# Patient Record
Sex: Male | Born: 1990
Health system: Southern US, Community
[De-identification: ages and names within clinical notes are randomized; demographics above are authoritative.]

## PROBLEM LIST (undated history)

## (undated) DIAGNOSIS — E669 Obesity, unspecified: Secondary | ICD-10-CM

## (undated) DIAGNOSIS — F909 Attention-deficit hyperactivity disorder, unspecified type: Secondary | ICD-10-CM

## (undated) DIAGNOSIS — L600 Ingrowing nail: Secondary | ICD-10-CM

## (undated) HISTORY — DX: Ingrowing nail: L60.0

## (undated) HISTORY — DX: Obesity, unspecified: E66.9

## (undated) HISTORY — DX: Attention-deficit hyperactivity disorder, unspecified type: F90.9

---
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
## 2012-10-01 ENCOUNTER — Emergency Department (HOSPITAL_COMMUNITY): Payer: Medicaid Other

## 2012-10-01 ENCOUNTER — Emergency Department (INDEPENDENT_AMBULATORY_CARE_PROVIDER_SITE_OTHER)
Admission: EM | Admit: 2012-10-01 | Discharge: 2012-10-01 | Disposition: A | Discharge status: Other Acute Inpatient Hospital | Payer: Medicaid Other | Source: Home / Self Care | Attending: Family Medicine | Admitting: Family Medicine

## 2012-10-01 ENCOUNTER — Emergency Department (HOSPITAL_COMMUNITY)
Admission: EM | Admit: 2012-10-01 | Discharge: 2012-10-01 | Disposition: A | Discharge status: Home / Self Care | Payer: Medicaid Other | Attending: Emergency Medicine | Admitting: Emergency Medicine

## 2012-10-01 ENCOUNTER — Encounter (HOSPITAL_COMMUNITY): Payer: Self-pay | Admitting: *Deleted

## 2012-10-01 ENCOUNTER — Encounter (HOSPITAL_COMMUNITY): Payer: Self-pay | Admitting: Emergency Medicine

## 2012-10-01 DIAGNOSIS — B349 Viral infection, unspecified: Secondary | ICD-10-CM

## 2012-10-01 DIAGNOSIS — R197 Diarrhea, unspecified: Secondary | ICD-10-CM | POA: Insufficient documentation

## 2012-10-01 DIAGNOSIS — R079 Chest pain, unspecified: Secondary | ICD-10-CM | POA: Insufficient documentation

## 2012-10-01 DIAGNOSIS — R1110 Vomiting, unspecified: Secondary | ICD-10-CM | POA: Insufficient documentation

## 2012-10-01 DIAGNOSIS — E860 Dehydration: Secondary | ICD-10-CM

## 2012-10-01 DIAGNOSIS — R05 Cough: Secondary | ICD-10-CM | POA: Insufficient documentation

## 2012-10-01 DIAGNOSIS — F17200 Nicotine dependence, unspecified, uncomplicated: Secondary | ICD-10-CM | POA: Insufficient documentation

## 2012-10-01 DIAGNOSIS — R509 Fever, unspecified: Secondary | ICD-10-CM

## 2012-10-01 DIAGNOSIS — B338 Other specified viral diseases: Secondary | ICD-10-CM | POA: Insufficient documentation

## 2012-10-01 DIAGNOSIS — R059 Cough, unspecified: Secondary | ICD-10-CM | POA: Insufficient documentation

## 2012-10-01 MED ORDER — IBUPROFEN 800 MG PO TABS: 800.0000 mg | ORAL_TABLET | Freq: Three times a day (TID) | ORAL | Status: DC

## 2012-10-01 MED ORDER — PROMETHAZINE HCL 25 MG PO TABS: 25.0000 mg | ORAL_TABLET | Freq: Four times a day (QID) | ORAL | Status: DC | PRN

## 2012-10-01 MED ORDER — ONDANSETRON 4 MG PO TBDP
8.0000 mg | ORAL_TABLET | Freq: Once | ORAL | Status: AC
  Administered 2012-10-01: 8 mg via ORAL

## 2012-10-01 MED ORDER — IBUPROFEN 800 MG PO TABS
ORAL_TABLET | ORAL | Status: AC
  Filled 2012-10-01: qty 1

## 2012-10-01 MED ORDER — ONDANSETRON 4 MG PO TBDP
ORAL_TABLET | ORAL | Status: AC
  Filled 2012-10-01: qty 2

## 2012-10-01 NOTE — ED Provider Notes (Signed)
History     CSN: [id number]  Arrival date & time 10/01/12  1502   First MD Initiated Contact with Patient 10/01/12 1557      Chief Complaint  Patient presents with  . Fever    (Consider location/radiation/quality/duration/timing/severity/associated sxs/prior treatment) Patient is a 21 y.o. male presenting with fever and chest pain. The history is provided by the patient.  Fever Primary symptoms of the febrile illness include fever, cough, vomiting and diarrhea. The current episode started today. This is a new problem. The problem has been gradually improving.  Associated with: cough. Risk factors: none. Chest Pain The chest pain began 6 - 12 hours ago. Chest pain occurs constantly. The severity of the pain is moderate. The quality of the pain is described as aching. The pain does not radiate. Chest pain is worsened by deep breathing. Primary symptoms include a fever, cough and vomiting. There are no known risk factors.  Pertinent negatives for past medical history include no MI.   Pt was seen at urgent care and sent to ED for evaluation.  Pt reports he is feeling better since taking ibuprofen.   He is not vomiting.  Pt reports he has been drinking fluids since leaving urgent care Pt complains of chest soreness. History reviewed. No pertinent past medical history.  History reviewed. No pertinent past surgical history.  History reviewed. No pertinent family history.  History  Substance Use Topics  . Smoking status: Current Every Day Smoker    Types: Cigarettes  . Smokeless tobacco: Not on file  . Alcohol Use: No      Review of Systems  Constitutional: Positive for fever.  Respiratory: Positive for cough.   Cardiovascular: Positive for chest pain.  Gastrointestinal: Positive for vomiting and diarrhea.  All other systems reviewed and are negative.    Allergies  Review of patient's allergies indicates no known allergies.  Home Medications  No current outpatient  prescriptions on file.  BP 114/97  Pulse 103  Temp 98.7 F (37.1 C) (Oral)  Resp 16  SpO2 100%  Physical Exam  Nursing note and vitals reviewed. Constitutional: He is oriented to person, place, and time. He appears well-developed and well-nourished.  HENT:  Head: Normocephalic.  Right Ear: External ear normal.  Left Ear: External ear normal.  Nose: Nose normal.  Mouth/Throat: Oropharynx is clear and moist.  Neck: Normal range of motion. Neck supple.  Cardiovascular: Normal rate, normal heart sounds and intact distal pulses.   Pulmonary/Chest: Effort normal. He exhibits no tenderness.  Abdominal: Soft. Bowel sounds are normal.  Musculoskeletal: Normal range of motion.  Neurological: He is alert and oriented to person, place, and time. He has normal reflexes.  Skin: Skin is warm.  Psychiatric: He has a normal mood and affect.    ED Course  Procedures (including critical care time)  Labs Reviewed - No data to display Dg Chest 2 View  10/01/2012  *RADIOLOGY REPORT*  Clinical Data: 21 year old male with fever, body aches and vomiting.  CHEST - 2 VIEW  Comparison: None  Findings: The cardiomediastinal silhouette is unremarkable. Mild peribronchial thickening is noted. There is no evidence of focal airspace disease, pulmonary edema, suspicious pulmonary nodule/mass, pleural effusion, or pneumothorax. No acute bony abnormalities are identified.  IMPRESSION: Mild peribronchial thickening without focal pneumonia.   Original Report Authenticated By: Harmon Pier, M.D.    EKG from Urgent care reviewed.  No acute.   Chest xray shows no pneumonia.   I suspect viral etiology.  I advised  ibuprofen 800mg .  I will give Rx for phenergan if needed for further vomitting  No diagnosis found.    MDM          Elson Areas, PA 10/01/12 1725  Lonia Skinner [place], [place] 10/01/12 [phone]

## 2012-10-01 NOTE — ED Provider Notes (Signed)
History     CSN: [id number]  Arrival date & time 10/01/12  1326   First MD Initiated Contact with Patient 10/01/12 1344      Chief Complaint  Patient presents with  . Emesis    (Consider location/radiation/quality/duration/timing/severity/associated sxs/prior treatment) HPI Comments: 21 year old smoker male with otherwise no significant past medical history. Here with mother concerned about fever, headache and vomiting x5 since last evening. No seizures. Last emesis was blood tinged. Patient also complaining of left-sided chest pain. Denies cough or congestion. No abdominal pain or diarrhea. Patient reports he was in good health until he started feeling sick about 5 PM yesterday. Patient has been unable to keep fluids down since last evening. No rashes. Denies dysuria. No leg swelling. Not taking any medications   History reviewed. No pertinent past medical history.  No past surgical history on file.  No family history on file.  History  Substance Use Topics  . Smoking status: Not on file  . Smokeless tobacco: Not on file  . Alcohol Use: Not on file      Review of Systems  Constitutional: Positive for fever, chills, diaphoresis and appetite change.  HENT: Negative for congestion, sore throat, rhinorrhea, trouble swallowing and sinus pressure.   Eyes: Negative for visual disturbance.  Respiratory: Negative for cough.   Cardiovascular: Positive for chest pain. Negative for leg swelling.  Gastrointestinal: Positive for nausea and vomiting. Negative for abdominal pain and diarrhea.  Skin: Negative for rash.  Neurological: Positive for headaches. Negative for dizziness.    Allergies  Review of patient's allergies indicates no known allergies.  Home Medications  No current outpatient prescriptions on file.  BP 109/62  Pulse 108  Temp 100.1 F (37.8 C) (Oral)  Resp 22  SpO2 97%  Physical Exam  Nursing note and vitals reviewed. Constitutional: He is oriented to  person, place, and time. He appears well-nourished.       Appears sick, calmed.  HENT:  Head: Normocephalic and atraumatic.  Right Ear: External ear normal.  Left Ear: External ear normal.  Nose: Nose normal.  Mouth/Throat: Oropharynx is clear and moist. No oropharyngeal exudate.  Eyes: Conjunctivae normal and EOM are normal. Pupils are equal, round, and reactive to light. Right eye exhibits no discharge. Left eye exhibits no discharge. No scleral icterus.  Neck: No JVD present.       No neck rigidity but reported discomfort in the back of the neck with nuchal flexion.  Cardiovascular: Normal rate, regular rhythm, normal heart sounds and intact distal pulses.  Exam reveals no gallop and no friction rub.   No murmur heard. Pulmonary/Chest: No respiratory distress. He has no wheezes. He has no rales.       Poor effort, bilateral breath sounds appear clear.  Abdominal: Soft. He exhibits no distension and no mass. There is no tenderness. There is no rebound and no guarding.       No costovertebral tenderness  Lymphadenopathy:    He has no cervical adenopathy.  Neurological: He is oriented to person, place, and time. He has normal reflexes. No cranial nerve deficit.       Cooperative responds appropriately to questions but appears lethargic  Skin: No rash noted. He is diaphoretic.    ED Course  Procedures (including critical care time)  Labs Reviewed - No data to display No results found.   1. Dehydration   2. Fever of unknown origin    EKG: normal sinus rhythm. Ventricular rate 80 beats per minutes.  Peaked T waves in some anterior leads probably related to early repolarization with nonspecific ST changes or other acute ischemic changes.   MDM  21 year old smoker male with no significant past medical history. Here with severe headache, chest pain, nausea and vomiting since yesterday. No cough or congestion. On exam: Temperature 102.1 improved after 800 mg of ibuprofen. Ill-appearing  even after defervescence. Dry lips. Lungs clear, no murmurs. Normal abdominal exam.  Unknown origin of fever. Decided to transfer to the emergency department for further evaluation and management.         Sharin Grave, MD 10/03/12 4098

## 2012-10-01 NOTE — ED Notes (Signed)
To ED for eval of fever and vomiting for past 24hrs. Seen at [facility] and sent to ED for further eval

## 2012-10-01 NOTE — ED Notes (Signed)
Patient has been vomiting, headache, CP, stomach ache. Patient was seen at [facility] and administered tylenol according to the patient. He is afebrile at this time.

## 2012-10-01 NOTE — ED Notes (Addendum)
Reports vomiting which started yesterday.  Patient is trying to keep himself hydrated.  Once patient has vomited all the food patient started vomiting liquid and blood. Denies sore throat or diarrhea.

## 2012-10-01 NOTE — ED Provider Notes (Signed)
Medical screening examination/treatment/procedure(s) were performed by non-physician practitioner and as supervising physician I was immediately available for consultation/collaboration.  Doug Sou, MD 10/01/12 2011

## 2012-11-12 ENCOUNTER — Emergency Department (INDEPENDENT_AMBULATORY_CARE_PROVIDER_SITE_OTHER)
Admission: EM | Admit: 2012-11-12 | Discharge: 2012-11-12 | Disposition: A | Discharge status: Home / Self Care | Payer: Medicaid Other | Source: Home / Self Care | Attending: Emergency Medicine | Admitting: Emergency Medicine

## 2012-11-12 ENCOUNTER — Encounter (HOSPITAL_COMMUNITY): Payer: Self-pay | Admitting: Emergency Medicine

## 2012-11-12 DIAGNOSIS — R1084 Generalized abdominal pain: Secondary | ICD-10-CM

## 2012-11-12 MED ORDER — IBUPROFEN 600 MG PO TABS: 600.0000 mg | ORAL_TABLET | Freq: Four times a day (QID) | ORAL | Status: DC | PRN

## 2012-11-12 NOTE — ED Provider Notes (Signed)
History     CSN: [id number]  Arrival date & time 11/12/12  1124   First MD Initiated Contact with Patient 11/12/12 1247      Chief Complaint  Patient presents with  . Flank Pain    (Consider location/radiation/quality/duration/timing/severity/associated sxs/prior treatment) HPI Comments: Patient presents urgent care describing for the last 2-3 days his left back and sides started hurting again. He works by lifting and pushing things most of the day"... pain gets worse when he moves a certain way or when he touches the affected area. Patient denies any abdominal pain, nausea vomiting or diarrheas or fevers. He basically describes that last summer he sustained a fall from a trampoline, he describes that the pain was solved after 3-4 days.  Patient denies any shortness of breath, nausea or vomiting. Have not taking anything for discomfort pain prior to arrival within the last 2-3 days.  Patient is a 22 y.o. male presenting with flank pain. The history is provided by the patient.  Flank Pain This is a new problem. The current episode started 2 days ago. The problem has been gradually worsening. Pertinent negatives include no abdominal pain. The symptoms are aggravated by bending and twisting. He has tried nothing for the symptoms. The treatment provided no relief.    History reviewed. No pertinent past medical history.  History reviewed. No pertinent past surgical history.  No family history on file.  History  Substance Use Topics  . Smoking status: Current Every Day Smoker    Types: Cigarettes  . Smokeless tobacco: Not on file  . Alcohol Use: No      Review of Systems  Constitutional: Positive for activity change. Negative for chills, diaphoresis and appetite change.  Gastrointestinal: Negative for nausea, vomiting, abdominal pain, constipation, blood in stool, abdominal distention, anal bleeding and rectal pain.  Genitourinary: Positive for flank pain. Negative for frequency,  hematuria, genital sores and testicular pain.  Skin: Negative for pallor, rash and wound.    Allergies  Review of patient's allergies indicates no known allergies.  Home Medications   Current Outpatient Rx  Name  Route  Sig  Dispense  Refill  . IBUPROFEN 600 MG PO TABS   Oral   Take 1 tablet (600 mg total) by mouth every 6 (six) hours as needed for pain.   15 tablet   0   . IBUPROFEN 800 MG PO TABS   Oral   Take 1 tablet (800 mg total) by mouth 3 (three) times daily.   21 tablet   0   . PROMETHAZINE HCL 25 MG PO TABS   Oral   Take 1 tablet (25 mg total) by mouth every 6 (six) hours as needed for nausea.   10 tablet   0     BP 144/81  Pulse 69  Temp 98.2 F (36.8 C) (Oral)  Resp 18  SpO2 100%  Physical Exam  Vitals reviewed. Constitutional: He appears well-developed and well-nourished.  HENT:  Head: Normocephalic.  Eyes: Conjunctivae normal are normal.  Cardiovascular: Normal rate.   No murmur heard. Abdominal: Soft. Normal appearance. He exhibits no distension. There is no hepatosplenomegaly or hepatomegaly. There is no tenderness. There is no rigidity, no rebound, no guarding, no CVA tenderness, no tenderness at McBurney's point and negative Murphy's sign.  Musculoskeletal: He exhibits tenderness.       Back:  Neurological: He is alert.  Skin: No rash noted. No erythema.    ED Course  Procedures (including critical care time)  Labs Reviewed - No data to display No results found.   1. Generalized muscular abdominal pain       MDM  Exam consistent with a Left middle back and lateral abdominal muscular strain/sprain- patient encouraged to take ibuprofen every 6-8 hours, for discomfort.        Jimmie Molly, MD 11/12/12 [phone]

## 2012-11-12 NOTE — ED Notes (Signed)
Patient has left side pain, left torso pain that started 2 days ago.  Patient reports he does a lot of lifting at work.  Patient and mother concerned for injury that occurred during the summer, fell awkwardly on trampoline, no physician evaluation at that time and pain resolved after 4 days per patient.  No difficulty voiding or having bm

## 2012-11-12 NOTE — ED Notes (Signed)
Mother leaving to pick up another child, will return.  Delaine Lame [phone].

## 2015-06-18 ENCOUNTER — Emergency Department (HOSPITAL_COMMUNITY): Payer: Medicare Other

## 2015-06-18 ENCOUNTER — Encounter (HOSPITAL_COMMUNITY): Payer: Self-pay | Admitting: Nurse Practitioner

## 2015-06-18 ENCOUNTER — Emergency Department (HOSPITAL_COMMUNITY)
Admission: EM | Admit: 2015-06-18 | Discharge: 2015-06-18 | Disposition: A | Discharge status: Home / Self Care | Payer: Medicare Other | Attending: Emergency Medicine | Admitting: Emergency Medicine

## 2015-06-18 DIAGNOSIS — R079 Chest pain, unspecified: Secondary | ICD-10-CM | POA: Insufficient documentation

## 2015-06-18 DIAGNOSIS — H11423 Conjunctival edema, bilateral: Secondary | ICD-10-CM | POA: Insufficient documentation

## 2015-06-18 DIAGNOSIS — R51 Headache: Secondary | ICD-10-CM | POA: Diagnosis not present

## 2015-06-18 DIAGNOSIS — E669 Obesity, unspecified: Secondary | ICD-10-CM | POA: Diagnosis not present

## 2015-06-18 DIAGNOSIS — R519 Headache, unspecified: Secondary | ICD-10-CM

## 2015-06-18 DIAGNOSIS — Z720 Tobacco use: Secondary | ICD-10-CM | POA: Insufficient documentation

## 2015-06-18 DIAGNOSIS — R0789 Other chest pain: Secondary | ICD-10-CM

## 2015-06-18 LAB — RAPID URINE DRUG SCREEN, HOSP PERFORMED
AMPHETAMINES: NOT DETECTED
BENZODIAZEPINES: NOT DETECTED
Barbiturates: NOT DETECTED
COCAINE: NOT DETECTED
OPIATES: NOT DETECTED
Tetrahydrocannabinol: NOT DETECTED

## 2015-06-18 LAB — CBC WITH DIFFERENTIAL/PLATELET
Basophils Absolute: 0 10*3/uL (ref 0.0–0.1)
Basophils Relative: 0 % (ref 0–1)
Eosinophils Absolute: 0.1 10*3/uL (ref 0.0–0.7)
Eosinophils Relative: 1 % (ref 0–5)
HEMATOCRIT: 41.5 % (ref 39.0–52.0)
Hemoglobin: 13.4 g/dL (ref 13.0–17.0)
LYMPHS ABS: 1.4 10*3/uL (ref 0.7–4.0)
LYMPHS PCT: 14 % (ref 12–46)
MCH: 27 pg (ref 26.0–34.0)
MCHC: 32.3 g/dL (ref 30.0–36.0)
MCV: 83.5 fL (ref 78.0–100.0)
Monocytes Absolute: 0.5 10*3/uL (ref 0.1–1.0)
Monocytes Relative: 4 % (ref 3–12)
NEUTROS ABS: 8.4 10*3/uL — AB (ref 1.7–7.7)
Neutrophils Relative %: 81 % — ABNORMAL HIGH (ref 43–77)
PLATELETS: 192 10*3/uL (ref 150–400)
RBC: 4.97 MIL/uL (ref 4.22–5.81)
RDW: 13 % (ref 11.5–15.5)
WBC: 10.4 10*3/uL (ref 4.0–10.5)

## 2015-06-18 LAB — URINALYSIS, ROUTINE W REFLEX MICROSCOPIC
BILIRUBIN URINE: NEGATIVE
GLUCOSE, UA: NEGATIVE mg/dL
Hgb urine dipstick: NEGATIVE
KETONES UR: NEGATIVE mg/dL
Leukocytes, UA: NEGATIVE
NITRITE: NEGATIVE
PH: 7.5 (ref 5.0–8.0)
Protein, ur: NEGATIVE mg/dL
Specific Gravity, Urine: 1.019 (ref 1.005–1.030)
UROBILINOGEN UA: 0.2 mg/dL (ref 0.0–1.0)

## 2015-06-18 LAB — BASIC METABOLIC PANEL
ANION GAP: 9 (ref 5–15)
BUN: 10 mg/dL (ref 6–20)
CO2: 26 mmol/L (ref 22–32)
Calcium: 9.2 mg/dL (ref 8.9–10.3)
Chloride: 105 mmol/L (ref 101–111)
Creatinine, Ser: 1.15 mg/dL (ref 0.61–1.24)
GFR calc Af Amer: >60 mL/min (ref >60)
GFR calc non Af Amer: >60 mL/min (ref >60)
Glucose, Bld: 101 mg/dL — ABNORMAL HIGH (ref 65–99)
POTASSIUM: 4.2 mmol/L (ref 3.5–5.1)
Sodium: 140 mmol/L (ref 135–145)

## 2015-06-18 LAB — I-STAT TROPONIN, ED: Troponin i, poc: 0 ng/mL (ref 0.00–0.08)

## 2015-06-18 MED ORDER — SODIUM CHLORIDE 0.9 % IV SOLN
Freq: Once | INTRAVENOUS | Status: AC
  Administered 2015-06-18: 12:00:00 via INTRAVENOUS

## 2015-06-18 MED ORDER — METOCLOPRAMIDE HCL 5 MG/ML IJ SOLN
10.0000 mg | Freq: Once | INTRAMUSCULAR | Status: AC
  Administered 2015-06-18: 10 mg via INTRAVENOUS
  Filled 2015-06-18: qty 2

## 2015-06-18 MED ORDER — DIPHENHYDRAMINE HCL 50 MG/ML IJ SOLN
25.0000 mg | Freq: Once | INTRAMUSCULAR | Status: AC
  Administered 2015-06-18: 25 mg via INTRAVENOUS
  Filled 2015-06-18: qty 1

## 2015-06-18 MED ORDER — BUTALBITAL-APAP-CAFFEINE 50-325-40 MG PO TABS: 1.0000 | ORAL_TABLET | Freq: Four times a day (QID) | ORAL | Status: AC | PRN

## 2015-06-18 NOTE — ED Provider Notes (Signed)
CSN: [id number]     Arrival date & time 06/18/15  1058 History   First MD Initiated Contact with Patient 06/18/15 1109     Chief Complaint  Patient presents with  . Headache     (Consider location/radiation/quality/duration/timing/severity/associated sxs/prior Treatment) HPI   Patient is a 24 year old male, former smoker, otherwise healthy, who presents to the emergency department with a sudden onset right sided frontal and temporal headache that is sharp without radiation, rated 10 out of 10, which is made worse with smells, light and movement, and has associated nausea, sweats, and "weakness all over my whole body," shortness of breath, and left-sided chest pain that is made worse with inspiration, palpation and movement. 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 He states it was preceded by some visual disturbances that he describes as transient vision loss and flashes of light. He currently denies blurry or double vision, but does still have photophobia.  He has had three headaches over the past several years and this one does not feel like his others, because it came on suddenly, was much more sharp and severe.  He denies any fever, chills, neck pain, neck stiffness, numbness, tingling, slurred speech, facial droop, aphasia, dysphagia, LOC, vomiting, facial pain, sinus pressure, congestion, unilateral lacrimation, muscle tension of neck or back, or head trauma.   He denies any cough, wheeze, palpitations, LE edema, recent travel, hx of blood clots or bleeding disorder.  He denies personal or family hx of cancer.  He denies illegal drug use or injected drugs.  Does not take any daily medication, has no known drug allergies, rarely uses ibuprofen, denies current smoking or ETOH use.    History reviewed. No pertinent past medical history. History reviewed. No pertinent past surgical history. History reviewed. No pertinent family  history. Social History  Substance Use Topics  . Smoking status: Current Some Day Smoker    Types: Cigarettes  . Smokeless tobacco: None  . Alcohol Use: No    Review of Systems 10 Systems reviewed and are negative for acute change except as noted in the HPI.      Allergies  Review of patient's allergies indicates no known allergies.  Home Medications   Prior to Admission medications   Medication Sig Start Date End Date Taking? Authorizing Provider  ibuprofen (ADVIL,MOTRIN) 200 MG tablet Take 400 mg by mouth every 6 (six) hours as needed for headache or mild pain.    Yes Historical Provider, MD   BP 105/62 mmHg  Pulse 63  Temp(Src) 98.5 F (36.9 C) (Oral)  Resp 13  Ht  (1.905 m)  Wt 235 lb (106.595 kg)  BMI 29.37 kg/m2  SpO2 100% Physical Exam  Constitutional: He is oriented to person, place, and time. He appears well-developed and well-nourished. No distress.  Obese young man, non-toxic appearing, well developed, well nourished, laying in ER gurney in the dark, with eyes closed, NAD  HENT:  Head: Normocephalic and atraumatic.  Right Ear: External ear normal.  Left Ear: External ear normal.  Nose: Nose normal.  Mouth/Throat: Oropharynx is clear and moist. No oropharyngeal exudate.  Eyes: EOM are normal. Pupils are equal, round, and reactive to light. Right eye exhibits no discharge. Left eye exhibits no discharge. No scleral icterus.  Conjunctiva erythematous bilaterally  Neck: Normal range of motion. Neck supple. No JVD present. No tracheal deviation present. No thyromegaly present.  Normal range of motion, without pain or stiffness  Negative Kernig and negative Brudzinski  Cardiovascular: Normal rate, regular rhythm, normal heart sounds and intact distal pulses.  Exam reveals no gallop and no friction rub.   No murmur heard. Pulmonary/Chest: Effort normal and breath sounds normal. No respiratory distress. He has no wheezes. He has no rales. He exhibits  tenderness.  CTA anteriorly and posteriorly, normal effort, no accessory muscle use, no tachypnea, no cough, left anterior chest wall tender to palpation  Abdominal: Soft. Bowel sounds are normal. He exhibits no distension and no mass. There is no tenderness. There is no rebound and no guarding.  Musculoskeletal: Normal range of motion. He exhibits no edema or tenderness.  Lymphadenopathy:    He has no cervical adenopathy.  Neurological: He is alert and oriented to person, place, and time. He has normal reflexes. No cranial nerve deficit. He exhibits normal muscle tone. Coordination normal.  Speech is clear and goal oriented, follows commands Major Cranial nerves without deficit, no facial droop, normal EOM without nystagmus, normal visual fields Normal strength in upper and lower extremities bilaterally including dorsiflexion and plantar flexion, strong and equal grip strength Sensation normal to light and sharp touch Moves extremities without ataxia, coordination intact, but generally slowed movement Normal finger to nose and rapid alternating movements Neg romberg, no pronator drift Normal gait and balance   Skin: Skin is warm and dry. No rash noted. He is not diaphoretic. No erythema. No pallor.  Psychiatric: He has a normal mood and affect. His behavior is normal. Judgment and thought content normal.  Nursing note and vitals reviewed.   ED Course  Procedures (including critical care time) Labs Review Labs Reviewed  BASIC METABOLIC PANEL - Abnormal; Notable for the following:    Glucose, Bld 101 (*)    All other components within normal limits  CBC WITH DIFFERENTIAL/PLATELET - Abnormal; Notable for the following:    Neutrophils Relative % 81 (*)    Neutro Abs 8.4 (*)    All other components within normal limits  URINALYSIS, ROUTINE W REFLEX MICROSCOPIC (NOT AT [facility])  URINE RAPID DRUG SCREEN, HOSP PERFORMED  I-STAT TROPOININ, ED    Imaging Review Dg Chest 2 View  06/18/2015    CLINICAL DATA:  Sudden onset right frontal and temporal headaches.  EXAM: CHEST  2 VIEW  COMPARISON:  10/01/2012  FINDINGS: The heart size and mediastinal contours are within normal limits. Both lungs are clear. The visualized skeletal structures are unremarkable.  IMPRESSION: No active cardiopulmonary disease.   Electronically Signed   By: Elige Ko   On: 06/18/2015 12:39   Ct Head Wo Contrast  06/18/2015   CLINICAL DATA:  Acute headache.  EXAM: CT HEAD WITHOUT CONTRAST  TECHNIQUE: Contiguous axial images were obtained from the base of the skull through the vertex without intravenous contrast.  COMPARISON:  None.  FINDINGS: Bony calvarium appears intact. No mass effect or midline shift is noted. Ventricular size is within normal limits. There is no evidence of mass lesion, hemorrhage or acute infarction.  IMPRESSION: Normal head CT.   Electronically Signed   By: Lupita Raider, M.D.   On: 06/18/2015 12:49   I, Danelle Berry, personally reviewed and evaluated these images and lab results as part of my medical decision-making.   EKG Interpretation   Date/Time:  Saturday June 18 2015 12:53:16 EDT Ventricular Rate:  66 PR Interval:  156 QRS Duration: 96 QT Interval:  374 QTC Calculation: 392 R Axis:   64 Text Interpretation:  Sinus arrhythmia j  point elevation. no change from  old Confirmed by Donnald Garre, MD, Lebron Conners [phone]) on 06/18/2015 1:17:33 PM      MDM   Final diagnoses:  None   Sudden onset HA - onset approx 3-4 hours ago Ddx - r/o SAH, suspect migraine-type ha, though patient does not carry a formal dx yet  Ct head negative for acute bleed - normal head CT CP reproducible, negative troponin, no EKG changes, no acute pulmonary process  Headache improved with headache cocktail, decreased photosensitivity, pt is asking to eat.  Pt is stable to d/c home with PCP f/u for headaches, possible migraine dx, will give neuro contact info, but with insurance referral will have to come from  PCP.  Dr. Donnald Garre has seen and evaluated the pt and is in agreement with plan and f/up with PCP and neuro for further evaluation of his HA's. Fioricet script given, advised pt to try Excedrin migraine with the onset of HA.  Follow-up encouraged with PCP, neuro referral given.   Danelle Berry, PA-C 06/23/15 [id number]  Arby Barrette, MD 06/23/15 [phone]

## 2015-06-18 NOTE — Discharge Instructions (Signed)
General Headache Without Cause °A headache is pain or discomfort felt around the head or neck area. The specific cause of a headache may not be found. There are many causes and types of headaches. A few common ones are: °· Tension headaches. °· Migraine headaches. °· Cluster headaches. °· Chronic daily headaches. °HOME CARE INSTRUCTIONS  °· Keep all follow-up appointments with your caregiver or any specialist referral. °· Only take over-the-counter or prescription medicines for pain or discomfort as directed by your caregiver. °· Lie down in a dark, quiet room when you have a headache. °· Keep a headache journal to find out what may trigger your migraine headaches. For example, write down: °¨ What you eat and drink. °¨ How much sleep you get. °¨ Any change to your diet or medicines. °· Try massage or other relaxation techniques. °· Put ice packs or heat on the head and neck. Use these 3 to 4 times per day for 15 to 20 minutes each time, or as needed. °· Limit stress. °· Sit up straight, and do not tense your muscles. °· Quit smoking if you smoke. °· Limit alcohol use. °· Decrease the amount of caffeine you drink, or stop drinking caffeine. °· Eat and sleep on a regular schedule. °· Get 7 to 9 hours of sleep, or as recommended by your caregiver. °· Keep lights dim if bright lights bother you and make your headaches worse. °SEEK MEDICAL CARE IF:  °· You have problems with the medicines you were prescribed. °· Your medicines are not working. °· You have a change from the usual headache. °· You have nausea or vomiting. °SEEK IMMEDIATE MEDICAL CARE IF:  °· Your headache becomes severe. °· You have a fever. °· You have a stiff neck. °· You have loss of vision. °· You have muscular weakness or loss of muscle control. °· You start losing your balance or have trouble walking. °· You feel faint or pass out. °· You have severe symptoms that are different from your first symptoms. °MAKE SURE YOU:  °· Understand these  instructions. °· Will watch your condition. °· Will get help right away if you are not doing well or get worse. °Document Released: 10/22/2005 Document Revised: 01/14/2012 Document Reviewed: 11/07/2011 °ExitCare® Patient Information ©2015 [employer]. This information is not intended to replace advice given to you by your health care provider. Make sure you discuss any questions you have with your health care provider. ° ° ° ° °Migraine Headache °A migraine headache is an intense, throbbing pain on one or both sides of your head. A migraine can last for 30 minutes to several hours. °CAUSES  °The exact cause of a migraine headache is not always known. However, a migraine may be caused when nerves in the brain become irritated and release chemicals that cause inflammation. This causes pain. °Certain things may also trigger migraines, such as: °· Alcohol. °· Smoking. °· Stress. °· Menstruation. °· Aged cheeses. °· Foods or drinks that contain nitrates, glutamate, aspartame, or tyramine. °· Lack of sleep. °· Chocolate. °· Caffeine. °· Hunger. °· Physical exertion. °· Fatigue. °· Medicines used to treat chest pain (nitroglycerine), birth control pills, estrogen, and some blood pressure medicines. °SIGNS AND SYMPTOMS °· Pain on one or both sides of your head. °· Pulsating or throbbing pain. °· Severe pain that prevents daily activities. °· Pain that is aggravated by any physical activity. °· Nausea, vomiting, or both. °· Dizziness. °· Pain with exposure to bright lights, loud noises, or activity. °·   General sensitivity to bright lights, loud noises, or smells. °Before you get a migraine, you may get warning signs that a migraine is coming (aura). An aura may include: °· Seeing flashing lights. °· Seeing bright spots, halos, or zigzag lines. °· Having tunnel vision or blurred vision. °· Having feelings of numbness or tingling. °· Having trouble talking. °· Having muscle weakness. °DIAGNOSIS  °A migraine headache is often  diagnosed based on: °· Symptoms. °· Physical exam. °· A CT scan or MRI of your head. These imaging tests cannot diagnose migraines, but they can help rule out other causes of headaches. °TREATMENT °Medicines may be given for pain and nausea. Medicines can also be given to help prevent recurrent migraines.  °HOME CARE INSTRUCTIONS °· Only take over-the-counter or prescription medicines for pain or discomfort as directed by your health care provider. The use of long-term narcotics is not recommended. °· Lie down in a dark, quiet room when you have a migraine. °· Keep a journal to find out what may trigger your migraine headaches. For example, write down: °¨ What you eat and drink. °¨ How much sleep you get. °¨ Any change to your diet or medicines. °· Limit alcohol consumption. °· Quit smoking if you smoke. °· Get 7-9 hours of sleep, or as recommended by your health care provider. °· Limit stress. °· Keep lights dim if bright lights bother you and make your migraines worse. °SEEK IMMEDIATE MEDICAL CARE IF:  °· Your migraine becomes severe. °· You have a fever. °· You have a stiff neck. °· You have vision loss. °· You have muscular weakness or loss of muscle control. °· You start losing your balance or have trouble walking. °· You feel faint or pass out. °· You have severe symptoms that are different from your first symptoms. °MAKE SURE YOU:  °· Understand these instructions. °· Will watch your condition. °· Will get help right away if you are not doing well or get worse. °Document Released: 10/22/2005 Document Revised: 03/08/2014 Document Reviewed: 06/29/2013 °ExitCare® Patient Information ©2015 [employer]. This information is not intended to replace advice given to you by your health care provider. Make sure you discuss any questions you have with your health care provider. ° ° °

## 2015-06-18 NOTE — ED Notes (Signed)
He c/o headaches for past 3 months, worse today. He was at work today and smells made him vomit. He c/o photophobia also. hes been taking OTC meds with minimal relief, advil was the most helpful. A&Ox4, resp e/u

## 2015-06-18 NOTE — ED Notes (Signed)
L. Tapia, PA, in w/pt. 

## 2015-06-18 NOTE — ED Notes (Signed)
Patient transported to X-ray 

## 2016-05-23 ENCOUNTER — Emergency Department (HOSPITAL_COMMUNITY): Payer: Commercial Managed Care - PPO

## 2016-05-23 ENCOUNTER — Encounter (HOSPITAL_COMMUNITY): Payer: Self-pay

## 2016-05-23 ENCOUNTER — Emergency Department (HOSPITAL_COMMUNITY)
Admission: EM | Admit: 2016-05-23 | Discharge: 2016-05-23 | Disposition: A | Discharge status: Home / Self Care | Payer: Commercial Managed Care - PPO | Attending: Emergency Medicine | Admitting: Emergency Medicine

## 2016-05-23 DIAGNOSIS — F17210 Nicotine dependence, cigarettes, uncomplicated: Secondary | ICD-10-CM | POA: Insufficient documentation

## 2016-05-23 DIAGNOSIS — Y999 Unspecified external cause status: Secondary | ICD-10-CM | POA: Diagnosis not present

## 2016-05-23 DIAGNOSIS — M25531 Pain in right wrist: Secondary | ICD-10-CM

## 2016-05-23 DIAGNOSIS — Y92410 Unspecified street and highway as the place of occurrence of the external cause: Secondary | ICD-10-CM | POA: Diagnosis not present

## 2016-05-23 DIAGNOSIS — Y9389 Activity, other specified: Secondary | ICD-10-CM | POA: Insufficient documentation

## 2016-05-23 MED ORDER — NAPROXEN 500 MG PO TABS: 500.0000 mg | ORAL_TABLET | Freq: Two times a day (BID) | ORAL | Status: DC

## 2016-05-23 MED ORDER — CYCLOBENZAPRINE HCL 10 MG PO TABS: 10.0000 mg | ORAL_TABLET | Freq: Two times a day (BID) | ORAL | Status: DC | PRN

## 2016-05-23 NOTE — ED Notes (Signed)
Shawn PA to bedside.
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
## 2016-05-23 NOTE — ED Notes (Signed)
Pt given discharge instructions, verbalized understanding of need to follow up, reasons to return to the ED and medications to take at home. Pt denied further questions or concerns. Pt ambulatory to exit without difficulty.
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
## 2016-05-23 NOTE — ED Notes (Signed)
Pt to XR
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
## 2016-06-12 ENCOUNTER — Encounter: Payer: Self-pay | Admitting: Medical

## 2016-06-12 ENCOUNTER — Ambulatory Visit (INDEPENDENT_AMBULATORY_CARE_PROVIDER_SITE_OTHER): Payer: Commercial Managed Care - PPO | Admitting: Medical

## 2016-06-12 VITALS — BP 120/60 | HR 60 | Resp 16 | Ht 75.0 in | Wt 262.0 lb

## 2016-06-12 DIAGNOSIS — R631 Polydipsia: Secondary | ICD-10-CM | POA: Diagnosis not present

## 2016-06-12 DIAGNOSIS — M25531 Pain in right wrist: Secondary | ICD-10-CM | POA: Diagnosis not present

## 2016-06-12 DIAGNOSIS — M79641 Pain in right hand: Secondary | ICD-10-CM | POA: Diagnosis not present

## 2016-06-12 LAB — POCT URINALYSIS DIPSTICK
BILIRUBIN UA: NEGATIVE
Blood, UA: NEGATIVE
Glucose, UA: NEGATIVE
KETONES UA: NEGATIVE
Leukocytes, UA: NEGATIVE
Nitrite, UA: NEGATIVE
PROTEIN UA: NEGATIVE
Urobilinogen, UA: NEGATIVE
pH, UA: 6
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
## 2016-06-12 NOTE — Addendum Note (Signed)
Addended by: Minette HeadlandBENFIELD, Arash Karstens L on: 06/12/2016 12:50 PM   Modules accepted: Orders

## 2016-07-17 ENCOUNTER — Encounter: Payer: Commercial Managed Care - PPO | Admitting: Medical

## 2016-08-20 ENCOUNTER — Encounter: Payer: Commercial Managed Care - PPO | Admitting: Medical

## 2016-09-05 HISTORY — PX: NO PAST SURGERIES: SHX2092

## 2016-09-11 ENCOUNTER — Encounter: Payer: Self-pay | Admitting: Medical

## 2016-09-11 ENCOUNTER — Ambulatory Visit (INDEPENDENT_AMBULATORY_CARE_PROVIDER_SITE_OTHER): Payer: Commercial Managed Care - PPO | Admitting: Medical

## 2016-09-11 VITALS — BP 112/70 | HR 70 | Ht 75.0 in | Wt 260.0 lb

## 2016-09-11 DIAGNOSIS — Z113 Encounter for screening for infections with a predominantly sexual mode of transmission: Secondary | ICD-10-CM | POA: Diagnosis not present

## 2016-09-11 DIAGNOSIS — Z833 Family history of diabetes mellitus: Secondary | ICD-10-CM | POA: Diagnosis not present

## 2016-09-11 DIAGNOSIS — E669 Obesity, unspecified: Secondary | ICD-10-CM

## 2016-09-11 DIAGNOSIS — Z0000 Encounter for general adult medical examination without abnormal findings: Secondary | ICD-10-CM | POA: Diagnosis not present

## 2016-09-11 DIAGNOSIS — Z23 Encounter for immunization: Secondary | ICD-10-CM | POA: Insufficient documentation

## 2016-09-11 DIAGNOSIS — Z7185 Encounter for immunization safety counseling: Secondary | ICD-10-CM

## 2016-09-11 DIAGNOSIS — Z7189 Other specified counseling: Secondary | ICD-10-CM

## 2016-09-11 DIAGNOSIS — Z1271 Encounter for screening for malignant neoplasm of testis: Secondary | ICD-10-CM

## 2016-09-11 LAB — POCT URINALYSIS DIPSTICK
Bilirubin, UA: NEGATIVE
GLUCOSE UA: NEGATIVE
Ketones, UA: NEGATIVE
Leukocytes, UA: NEGATIVE
NITRITE UA: NEGATIVE
PH UA: 6
PROTEIN UA: NEGATIVE
RBC UA: NEGATIVE
SPEC GRAV UA: 1.02
UROBILINOGEN UA: NEGATIVE

## 2016-09-11 LAB — COMPREHENSIVE METABOLIC PANEL
ALBUMIN: 4.7 g/dL (ref 3.6–5.1)
ALT: 13 U/L (ref 9–46)
AST: 18 U/L (ref 10–40)
Alkaline Phosphatase: 45 U/L (ref 40–115)
BILIRUBIN TOTAL: 0.4 mg/dL (ref 0.2–1.2)
BUN: 9 mg/dL (ref 7–25)
CHLORIDE: 106 mmol/L (ref 98–110)
CO2: 25 mmol/L (ref 20–31)
CREATININE: 1.33 mg/dL (ref 0.60–1.35)
Calcium: 9.9 mg/dL (ref 8.6–10.3)
GLUCOSE: 96 mg/dL (ref 65–99)
Potassium: 4.3 mmol/L (ref 3.5–5.3)
SODIUM: 139 mmol/L (ref 135–146)
Total Protein: 7.3 g/dL (ref 6.1–8.1)

## 2016-09-11 LAB — LIPID PANEL
CHOL/HDL RATIO: 5.2 ratio — AB (ref <5.0)
Cholesterol: 194 mg/dL (ref <200)
HDL: 37 mg/dL — AB (ref >40)
LDL CALC: 140 mg/dL — AB
Triglycerides: 87 mg/dL (ref <150)
VLDL: 17 mg/dL (ref <30)

## 2016-09-11 LAB — CBC
HEMATOCRIT: 41.5 % (ref 38.5–50.0)
Hemoglobin: 13.6 g/dL (ref 13.2–17.1)
MCH: 27 pg (ref 27.0–33.0)
MCHC: 32.8 g/dL (ref 32.0–36.0)
MCV: 82.3 fL (ref 80.0–100.0)
MPV: 11.3 fL (ref 7.5–12.5)
Platelets: 221 10*3/uL (ref 140–400)
RBC: 5.04 MIL/uL (ref 4.20–5.80)
RDW: 13.9 % (ref 11.0–15.0)
WBC: 4.3 10*3/uL (ref 4.0–10.5)

## 2016-09-11 LAB — TSH: TSH: 1.17 m[IU]/L (ref 0.40–4.50)
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
## 2016-09-12 LAB — GC/CHLAMYDIA PROBE AMP
CT Probe RNA: NOT DETECTED
GC PROBE AMP APTIMA: NOT DETECTED

## 2016-09-12 LAB — RPR

## 2016-09-12 LAB — HIV ANTIBODY (ROUTINE TESTING W REFLEX): HIV 1&2 Ab, 4th Generation: NONREACTIVE

## 2016-09-12 LAB — HEMOGLOBIN A1C
HEMOGLOBIN A1C: 5.4 % (ref <5.7)
Mean Plasma Glucose: 108 mg/dL
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
## 2019-11-06 HISTORY — PX: NO PAST SURGERIES: SHX2092

## 2019-11-12 ENCOUNTER — Ambulatory Visit (INDEPENDENT_AMBULATORY_CARE_PROVIDER_SITE_OTHER): Payer: Medicare Other | Admitting: Medical

## 2019-11-12 ENCOUNTER — Telehealth: Payer: Self-pay | Admitting: Medical

## 2019-11-12 ENCOUNTER — Other Ambulatory Visit: Payer: Self-pay

## 2019-11-12 ENCOUNTER — Encounter: Payer: Self-pay | Admitting: Medical

## 2019-11-12 VITALS — BP 100/76 | HR 78 | Temp 97.6°F | Ht 75.0 in | Wt 261.0 lb

## 2019-11-12 DIAGNOSIS — Z6832 Body mass index (BMI) 32.0-32.9, adult: Secondary | ICD-10-CM

## 2019-11-12 DIAGNOSIS — Z131 Encounter for screening for diabetes mellitus: Secondary | ICD-10-CM | POA: Diagnosis not present

## 2019-11-12 DIAGNOSIS — L738 Other specified follicular disorders: Secondary | ICD-10-CM

## 2019-11-12 DIAGNOSIS — Z720 Tobacco use: Secondary | ICD-10-CM

## 2019-11-12 DIAGNOSIS — M2140 Flat foot [pes planus] (acquired), unspecified foot: Secondary | ICD-10-CM | POA: Insufficient documentation

## 2019-11-12 DIAGNOSIS — M2142 Flat foot [pes planus] (acquired), left foot: Secondary | ICD-10-CM

## 2019-11-12 DIAGNOSIS — M2141 Flat foot [pes planus] (acquired), right foot: Secondary | ICD-10-CM

## 2019-11-12 DIAGNOSIS — Z0000 Encounter for general adult medical examination without abnormal findings: Secondary | ICD-10-CM

## 2019-11-12 NOTE — Patient Instructions (Signed)
Preventative Care for Adults - Male    Thank you for coming in for your well visit today, and thank you for trusting [place] with your care!   Maintain regular health and wellness exams:  A routine yearly physical is a good way to check in with your primary care provider about your health and preventive screening. It is also an opportunity to share updates about your health and any concerns you have, and receive a thorough all-over exam.   Most health insurance companies pay for at least some preventative services.  Check with your health plan for specific coverages.  What preventative services do men need?  Adult men should have their weight and blood pressure checked regularly.   Men age 87 and older should have their cholesterol levels checked regularly.  Beginning at age 26 and continuing to age 44, men should be screened for colorectal cancer.  Certain people may need continued testing until age 11.  Updating vaccinations is part of preventative care.  Vaccinations help protect against diseases such as the flu.  Osteoporosis is a disease in which the bones lose minerals and strength as we age. Men ages 67 and over should discuss this with their caregivers  Lab tests are generally done as part of preventative care to screen for anemia and blood disorders, to screen for problems with the kidneys and liver, to screen for bladder problems, to check blood sugar, and to check your cholesterol level.  Preventative services generally include counseling about diet, exercise, avoiding tobacco, drugs, excessive alcohol consumption, and sexually transmitted infections.   Xrays and CT scans are not normally done as a preventative test, and most insurances do not pay for imaging for screening other than as discussed under cancer screens below.   On the other hand, if you have certain medical concerns, imaging may be necessary as a diagnostic test.    Your Medical Team Your medical team starts with  [place], your PCP or primary care provider.  Please use our services for your routine care such as physicals, screenings, immunizations, sick visits, and your first stop for general medical concerns.  You can call our number for after hours information for urgent questions that may need attention but cannot wait til the next business day.    Urgent care-urgent cares exist to provide care when your primary care office would typically be closed such as evenings or weekends.   Urgent care is for evaluation of urgent medical problems that do not necessarily require emergency department care, but cannot wait til the next business day when we are open.  Emergency department care-please reserve emergency department care for serious, urgent, possibly life-threatening medical problems.  This includes issues like possible stroke, heart attack, significant injury, mental health crisis, or other urgent need that requires immediate medical attention.     See your dentist office twice yearly for hygiene and cleaning visits.   Brush your teeth and floss your teeth daily.  See your eye doctor yearly for routine eye exam and screenings for glaucoma and retinal disease.   Specific Concerns: Flat feet - check with [employer] to find some good supportive shoes for flat feet  STOP TOBACCO   Vaccines:  Stay up to date with your tetanus shots and other required immunizations. You should have a booster for tetanus every 10 years. Be sure to get your flu shot every year, since 5%-20% of the [place] population comes down with the flu. The flu vaccine changes each year, so  being vaccinated once is not enough. Get your shot in the fall, before the flu season peaks.   Other vaccines to consider:  Pneumococcal vaccine to protect against certain types of pneumonia.  This is normally recommended for adults age 61 or older.  However, adults younger than 27 years old with certain underlying conditions such as diabetes, heart or lung  disease should also receive the vaccine.  Shingles vaccine to protect against Varicella Zoster if you are older than age 70, or younger than 27 years old with certain underlying illness.  If you have not had the Shingrix vaccine, please call your insurer to inquire about coverage for the Shingrix vaccine given in 2 doses.   Some insurers cover this vaccine after age 6, some cover this after age 24.  If your insurer covers this, then call to schedule appointment to have this vaccine here  Hepatitis A vaccine to protect against a form of infection of the liver by a virus acquired from food.  Hepatitis B vaccine to protect against a form of infection of the liver by a virus acquired from blood or body fluids, particularly if you work in health care.  If you plan to travel internationally, check with your local health department for specific vaccination recommendations.  Human Papilloma Virus or HPV causes cancer of the cervix, and other infections that can be transmitted from person to person. There is a vaccine for HPV, and males should get immunized between the ages of 8 and 59. It requires a series of 3 shots.   Covid/Coronavirus - as the vaccines are becoming available, please consider vaccination if you are a health care worker, first responder, or have significant health problems such as asthma, COPD, heart disease, hypertension, diabetes, obesity, multiple medical problems, over age 19yo, or immunocompromised.       What should I know about Cancer screening? Many types of cancers can be detected early and may often be prevented. Lung Cancer  You should be screened every year for lung cancer if: ? You are a current smoker who has smoked for at least 30 years. ? You are a former smoker who has quit within the past 15 years.  Talk to your health care provider about your screening options, when you should start screening, and how often you should be screened.  Colorectal  Cancer  Routine colorectal cancer screening usually begins at 27 years of age and should be repeated every 5-10 years until you are 27 years old. You may need to be screened more often if early forms of precancerous polyps or small growths are found. Your health care provider may recommend screening at an earlier age if you have risk factors for colon cancer.  Your health care provider may recommend using home test kits to check for hidden blood in the stool.  A small camera at the end of a tube can be used to examine your colon (sigmoidoscopy or colonoscopy). This checks for the earliest forms of colorectal cancer.  Prostate and Testicular Cancer  Depending on your age and overall health, your health care provider may do certain tests to screen for prostate and testicular cancer.  Talk to your health care provider about any symptoms or concerns you have about testicular or prostate cancer.  Skin Cancer  Check your skin from head to toe regularly.  Tell your health care provider about any new moles or changes in moles, especially if: ? There is a change in a mole's size, shape, or  color. ? You have a mole that is larger than a pencil eraser.  Always use sunscreen. Apply sunscreen liberally and repeat throughout the day.  Protect yourself by wearing long sleeves, pants, a wide-brimmed hat, and sunglasses when outside.   Are you up to date on cancer screenings:  COLON CANCER SCREENING:  Age 24  PROSTATE CANCER SCREENING:  Age 48  TESTICULAR CANCER SCREENING:  check your self monthly at home     GENERAL RECOMMENDATIONS FOR GOOD HEALTH:  Healthy diet:  Eat a variety of foods, including fruit, vegetables, animal or vegetable protein, such as meat, fish, chicken, and eggs, or beans, lentils, tofu, and grains, such as rice.  Drink plenty of water daily.  Decrease saturated fat in the diet, avoid lots of red meat, processed foods, sweets, fast foods, and fried  foods.  Exercise:  Aerobic exercise helps maintain good heart health. At least 30-40 minutes of moderate-intensity exercise is recommended. For example, a brisk walk that increases your heart rate and breathing. This should be done on most days of the week.   Find a type of exercise or a variety of exercises that you enjoy so that it becomes a part of your daily life.  Examples are running, walking, swimming, water aerobics, and biking.  For motivation and support, explore group exercise such as aerobic class, spin class, Zumba, Yoga,or  martial arts, etc.    Set exercise goals for yourself, such as a certain weight goal, walk or run in a race such as a 5k walk/run.  Speak to your primary care provider about exercise goals.  Your weight readings per our records: Wt Readings from Last 3 Encounters:  11/12/19 261 lb (118.4 kg)  09/11/16 260 lb (117.9 kg)  06/12/16 262 lb (118.8 kg)    Body mass index is 32.62 kg/m.    Disease prevention:  If you smoke or chew tobacco, find out from your caregiver how to quit. It can literally save your life, no matter how long you have been a tobacco user. If you do not use tobacco, never begin.   Maintain a healthy diet and normal weight. Increased weight leads to problems with blood pressure and diabetes.   The Body Mass Index or BMI is a way of measuring how much of your body is fat. Having a BMI above 27 increases the risk of heart disease, diabetes, hypertension, stroke and other problems related to obesity. Your caregiver can help determine your BMI and based on it develop an exercise and dietary program to help you achieve or maintain this important measurement at a healthful level.  High blood pressure causes heart and blood vessel problems.  Persistent high blood pressure should be treated with medicine if weight loss and exercise do not work.  Your blood pressure readings per our records:     BP Readings from Last 3 Encounters:  11/12/19  100/76  09/11/16 112/70  06/12/16 120/60     Fat and cholesterol leaves deposits in your arteries that can block them. This causes heart disease and vessel disease elsewhere in your body.  If your cholesterol is found to be high, or if you have heart disease or certain other medical conditions, then you may need to have your cholesterol monitored frequently and be treated with medication.   Ask if you should have a cardiac stress test if your history suggests this. A stress test is a test done on a treadmill that looks for heart disease. This test can find  disease prior to there being a problem.    Heart disease screening:   n/a   Osteoporosis is a disease in which the bones lose minerals and strength as we age. This can result in serious bone fractures. Risk of osteoporosis can be identified using a bone density scan. Men ages 73 and over should discuss this with their caregivers. Ask your caregiver whether you should be taking a calcium supplement and Vitamin D, to reduce the rate of osteoporosis.   Avoid drinking alcohol in excess (more than two drinks per day).  Avoid use of street drugs. Do not share needles with anyone. Ask for professional help if you need assistance or instructions on stopping the use of alcohol, cigarettes, and/or drugs.  Brush your teeth twice a day with fluoride toothpaste, and floss once a day. Good oral hygiene prevents tooth decay and gum disease. The problems can be painful, unattractive, and can cause other health problems. Visit your dentist for a routine oral and dental check up and preventive care every 6-12 months.   Safety:  Use seatbelts 100% of the time, whether driving or as a passenger.  Use safety devices such as hearing protection if you work in environments with loud noise or significant background noise.  Use safety glasses when doing any work that could send debris in to the eyes.  Use a helmet if you ride a bike or motorcycle.  Use appropriate  safety gear for contact sports.  Talk to your caregiver about gun safety.  Use sunscreen with a SPF (or skin protection factor) of 15 or greater.  Lighter skinned people are at a greater risk of skin cancer. Don't forget to also wear sunglasses in order to protect your eyes from too much damaging sunlight. Damaging sunlight can accelerate cataract formation.   Keep carbon monoxide and smoke detectors in your home functioning at all times. Change the batteries every 6 months or use a model that plugs into the wall.    Sexual activity: . Sex is a normal part of life and sexual activity can continue into older adulthood for many healthy people.   . If you are having erectile dysfunction issues, please follow up to discuss this further.   . If you are not in a monogamous relationship or have more than one partner, please practice safe sex.  Use condoms. Condoms are used for birth control and to help reduce the spread of sexually transmitted infections (or STIs).  Some of the STIs are gonorrhea (the clap), chlamydia, syphilis, trichomonas, herpes, HPV (human papilloma virus) and HIV (human immunodeficiency virus) which causes AIDS. The herpes, HIV and HPV are viral illnesses that have no cure. These can result in disability, cancer and death.   We are able to test for STIs here at our office.

## 2019-11-12 NOTE — Telephone Encounter (Signed)
Please abstract or update [facility] vaccines to Epic chart record

## 2019-11-12 NOTE — Progress Notes (Signed)
Subjective:   HPI  Duane Beck is a 29 y.o. male who presents for Chief Complaint  Patient presents with  . Annual Exam    with fastins labs    Patient Care Team: Danaja Lasota, Camelia Eng, PA-C as PCP - General (Family Medicine) Sees dentist Sees eye doctor  Concerns: Here for physical.  No specific findings.  He does want his foot looked at.  He has flat feet.  Not painful and no specific issues.  He is exercising some with walking and calisthenics and push-ups.  Not currently employed.  Has history of ADHD.  Has not been on medication for years.   Reviewed their medical, surgical, family, social, medication, and allergy history and updated chart as appropriate.  Past Medical History:  Diagnosis Date  . ADHD    no medication in years  . Ingrown toenail   . Obesity     Past Surgical History:  Procedure Laterality Date  . NO PAST SURGERIES  11/2019    Social History   Socioeconomic History  . Marital status: Single    Spouse name: Not on file  . Number of children: Not on file  . Years of education: Not on file  . Highest education level: Not on file  Occupational History  . Not on file  Tobacco Use  . Smoking status: Current Some Day Smoker    Years: 5.00    Types: Cigarettes, E-cigarettes  . Smokeless tobacco: Current User  . Tobacco comment: vaping  Substance and Sexual Activity  . Alcohol use: No  . Drug use: No  . Sexual activity: Yes    Birth control/protection: Condom  Other Topics Concern  . Not on file  Social History Narrative   Lives with parents.   Currently unemployed.   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  Had job with [employer] and prior with [employer] as well.  Exercise - walking and calisthenics.   As of 11/2019.   Social Determinants of Health   Financial Resource Strain:   . Difficulty of Paying Living Expenses: Not on file  Food Insecurity:   . Worried About Charity fundraiser in the Last Year: Not on file  . Ran Out of Food in the Last Year:  Not on file  Transportation Needs:   . Lack of Transportation (Medical): Not on file  . Lack of Transportation (Non-Medical): Not on file  Physical Activity:   . Days of Exercise per Week: Not on file  . Minutes of Exercise per Session: Not on file  Stress:   . Feeling of Stress : Not on file  Social Connections:   . Frequency of Communication with Friends and Family: Not on file  . Frequency of Social Gatherings with Friends and Family: Not on file  . Attends Religious Services: Not on file  . Active Member of Clubs or Organizations: Not on file  . Attends Archivist Meetings: Not on file  . Marital Status: Not on file  Intimate Partner Violence:   . Fear of Current or Ex-Partner: Not on file  . Emotionally Abused: Not on file  . Physically Abused: Not on file  . Sexually Abused: Not on file    Family History  Problem Relation Age of Onset  . Hypertension Mother   . Hyperlipidemia Mother   . Diabetes Mother   . Sleep apnea Mother   . Diabetes Father   . Cancer Neg Hx   . Heart disease Neg Hx   .  Stroke Neg Hx     No current outpatient medications on file.  Allergies  Allergen Reactions  . Other     Cat dander, pollen/environmental     Review of Systems Constitutional: -fever, -chills, -sweats, -unexpected weight change, -decreased appetite, -fatigue Allergy: -sneezing, -itching, -congestion Dermatology: -changing moles, --rash, -lumps ENT: -runny nose, -ear pain, -sore throat, -hoarseness, -sinus pain, -teeth pain, - ringing in ears, -hearing loss, -nosebleeds Cardiology: -chest pain, -palpitations, -swelling, -difficulty breathing when lying flat, -waking up short of breath Respiratory: -cough, -shortness of breath, -difficulty breathing with exercise or exertion, -wheezing, -coughing up blood Gastroenterology: -abdominal pain, -nausea, -vomiting, -diarrhea, -constipation, -blood in stool, -changes in bowel movement, -difficulty swallowing or  eating Hematology: -bleeding, -bruising  Musculoskeletal: -joint aches, -muscle aches, -joint swelling, -back pain, -neck pain, -cramping, -changes in gait Ophthalmology: denies vision changes, eye redness, itching, discharge Urology: -burning with urination, -difficulty urinating, -blood in urine, -urinary frequency, -urgency, -incontinence Neurology: -headache, -weakness, -tingling, -numbness, -memory loss, -falls, -dizziness Psychology: -depressed mood, -agitation, -sleep problems Male GU: no testicular mass, pain, no lymph nodes swollen, no swelling, no rash.     Objective:  BP 100/76   Pulse 78   Temp 97.6 F (36.4 C)   Ht '6\' 3"'  (1.905 m)   Wt 261 lb (118.4 kg)   SpO2 98%   BMI 32.62 kg/m   General appearance: alert, no distress, WD/WN, African American male Skin: rough nodular skin of neck and face in bear pattern c/w folliculitis barbae but no current erythema or inflammation, chronic appearing HEENT: normocephalic, conjunctiva/corneas normal, sclerae anicteric, PERRLA, EOMi, nares patent, no discharge or erythema, pharynx normal Oral cavity: MMM, tongue normal, teeth normal Neck: supple, no lymphadenopathy, no thyromegaly, no masses, normal ROM, no bruits Chest: non tender, normal shape and expansion Heart: RRR, normal S1, S2, no murmurs Lungs: CTA bilaterally, no wheezes, rhonchi, or rales Abdomen: +bs, soft, non tender, non distended, no masses, no hepatomegaly, no splenomegaly, no bruits Back: non tender, normal ROM, no scoliosis Musculoskeletal: bilat pes planus, upper extremities non tender, no obvious deformity, normal ROM throughout, lower extremities non tender, no obvious deformity, normal ROM throughout Extremities: no edema, no cyanosis, no clubbing Pulses: 2+ symmetric, upper and lower extremities, normal cap refill Neurological: alert, oriented x 3, CN2-12 intact, strength normal upper extremities and lower extremities, sensation normal throughout, DTRs 2+  throughout, no cerebellar signs, gait normal Psychiatric: normal affect, behavior normal, pleasant  GU: normal male external genitalia, nontender, no masses, no hernia, no lymphadenopathy Rectal: deferred   Assessment and Plan :   Encounter Diagnoses  Name Primary?  . Encounter for health maintenance examination in adult Yes  . Tobacco use   . Pes planus of both feet   . Screening for diabetes mellitus   . BMI 32.0-32.9,adult     Physical exam - discussed and counseled on healthy lifestyle, diet, exercise, preventative care, vaccinations, sick and well care, proper use of emergency dept and after hours care, and addressed their concerns.    Limited lab screening today.  He declines other labs   Health screening: See your eye doctor yearly for routine vision care. See your dentist yearly for routine dental care including hygiene visits twice yearly.  Discussed STD testing, discussed prevention, condom use, means of transmission  Cancer screening Advised monthly self testicular exam   Vaccinations: Advised yearly influenza vaccine Patient declines influenza vaccine  I reviewed the vaccine info we have on file which is not complete.  He declines  titers or other vaccination.  Vaccine info was not complete for varicella, MMR, Hep B.     Separate significant chronic issues discussed: Tobacco use , mild - advised of risks, advised cessation  BMI elevated - counseled on healthy lifestyle, need for weight loss  Folliculitis barbae - no current concerns   Pes planus - counseled on trying some footwear with better arch support  Jalien was seen today for annual exam.  Diagnoses and all orders for this visit:  Encounter for health maintenance examination in adult -     Basic Metabolic Panel -     Hemoglobin A1c  Tobacco use  Pes planus of both feet  Screening for diabetes mellitus -     Basic Metabolic Panel -     Hemoglobin A1c  BMI 32.0-32.9,adult    Follow-up  pending labs, yearly for physical

## 2019-11-13 LAB — BASIC METABOLIC PANEL
BUN/Creatinine Ratio: 10 (ref 9–20)
BUN: 12 mg/dL (ref 6–20)
CO2: 22 mmol/L (ref 20–29)
Calcium: 9.5 mg/dL (ref 8.7–10.2)
Chloride: 105 mmol/L (ref 96–106)
Creatinine, Ser: 1.26 mg/dL (ref 0.76–1.27)
GFR calc Af Amer: 90 mL/min/{1.73_m2} (ref >59)
GFR calc non Af Amer: 78 mL/min/{1.73_m2} (ref >59)
Glucose: 83 mg/dL (ref 65–99)
Potassium: 4.8 mmol/L (ref 3.5–5.2)
Sodium: 140 mmol/L (ref 134–144)

## 2019-11-13 LAB — HEMOGLOBIN A1C
Est. average glucose Bld gHb Est-mCnc: 108 mg/dL
Hgb A1c MFr Bld: 5.4 % (ref 4.8–5.6)

## 2020-05-16 ENCOUNTER — Ambulatory Visit (INDEPENDENT_AMBULATORY_CARE_PROVIDER_SITE_OTHER): Payer: Medicare Other | Admitting: Family Medicine

## 2020-05-16 ENCOUNTER — Encounter: Payer: Self-pay | Admitting: Family Medicine

## 2020-05-16 ENCOUNTER — Other Ambulatory Visit: Payer: Self-pay

## 2020-05-16 VITALS — BP 118/64 | HR 65 | Temp 97.2°F | Resp 18 | Ht 75.0 in | Wt 271.2 lb

## 2020-05-16 DIAGNOSIS — Z8669 Personal history of other diseases of the nervous system and sense organs: Secondary | ICD-10-CM | POA: Diagnosis not present

## 2020-05-16 DIAGNOSIS — L738 Other specified follicular disorders: Secondary | ICD-10-CM

## 2020-05-16 DIAGNOSIS — Z8659 Personal history of other mental and behavioral disorders: Secondary | ICD-10-CM | POA: Diagnosis not present

## 2020-05-16 DIAGNOSIS — Z720 Tobacco use: Secondary | ICD-10-CM

## 2020-05-16 NOTE — Progress Notes (Signed)
Subjective  CC:  Chief Complaint  Patient presents with  . New Patient (Initial Visit)    Switched to our office due to new insurance. Non-fasting labs    HPI: Duane Beck is a 29 y.o. male who presents to [facility] today to establish care with me as a new patient.   He has the following concerns or needs:  Pleasant 30 year old single male presents to establish care.  Reviewed his chart in detail.  Records reviewed back to 2001.  Reviewed last physical from January 2021.  Mostly healthy young male.  Immunizations up-to-date.  He did receive his Covid vaccinations this year.  He has a history of folliculitis barbae is not currently active.  From chart review he has had a history of migraine headaches though not currently active.  In middle school, he was treated for ADD.  He no longer feels that his symptoms are prominent.  He does report history of anger issues, he did go to counseling.  Social history: Currently unemployed and living with his parents.  He has several job prospects.  Reports he is well adjusted, has a decent social life, currently single and happy.  He has a long history of vaping on the last 10 years or so but has cut back significantly.  Currently vaping about once weekly.  He would consider quitting.  Has had no complications.  Assessment  1. Folliculitis barbae   2. Current nicotine vaping on some days   3. History of ADHD   4. History of migraine      Plan   Folliculitis therapy: Education given.  Treat if active inflammation, pain or drainage develop.  Nicotine cessation counseling given.  Recommend quitting.  Discussed risks of developing dependence again.  Discussed health risks.  Patient will work on cessation  History of ADD and migraine headaches: Both not active.  Will monitor for recurrence of symptoms.  Health maintenance again due January 2021 Total time spent on chart review, reviewing medical records and lab  work, history and physical exam patient charting was 33 minutes Follow up: Complete physical January 2021 No orders of the defined types were placed in this encounter.  No orders of the defined types were placed in this encounter.    Depression screen [name] 2/9 05/16/2020 11/12/2019 09/11/2016  Decreased Interest 2 0 0  Down, Depressed, Hopeless 0 0 0  PHQ - 2 Score 2 0 0  Altered sleeping 1 - -  Tired, decreased energy 0 - -  Change in appetite 0 - -  Feeling bad or failure about yourself  0 - -  Trouble concentrating 0 - -  Moving slowly or fidgety/restless 0 - -  Suicidal thoughts 0 - -  PHQ-9 Score 3 - -  Difficult doing work/chores Not difficult at all - -    We updated and reviewed the patient's past history in detail and it is documented below.  Patient Active Problem List   Diagnosis Date Noted  . History of ADHD 05/16/2020  . History of migraine 05/16/2020  . Current nicotine vaping on some days 11/12/2019  . Flat foot 11/12/2019  . Folliculitis barbae 17/49/4496  . Obesity without serious comorbidity 09/11/2016   Health Maintenance  Topic Date Due  . Hepatitis C Screening  Never done  . COVID-19 Vaccine (1) Never done  . INFLUENZA VACCINE  06/05/2020  . TETANUS/TDAP  09/11/2026  . HIV Screening  Completed   Immunization History  Administered  Date(s) Administered  . DTaP 04/29/1998  . HPV Quadrivalent 02/03/2010, 10/13/2010, 07/13/2011  . Hepatitis A 10/17/2007, 11/13/2010  . Influenza-Unspecified 10/17/2007  . MMR 04/29/1998  . Meningococcal Conjugate 02/03/2009  . Td 09/11/2016  . Tdap 02/08/2005   No outpatient medications have been marked as taking for the 05/16/20 encounter (Office Visit) with Leamon Arnt, MD.    Allergies: Patient is allergic to other. Past Medical History Patient  has a past medical history of ADHD and Obesity. Past Surgical History Patient  has no past surgical history on file. Family History: Patient family history includes  Diabetes in his father and mother; Hyperlipidemia in his mother; Hypertension in his mother; Sleep apnea in his mother. Social History:  Patient  reports that he has quit smoking. His smoking use included cigarettes. He quit after 1.00 year of use. He has never used smokeless tobacco. He reports that he does not drink alcohol and does not use drugs.  Review of Systems: Constitutional: negative for fever or malaise Ophthalmic: negative for photophobia, double vision or loss of vision Cardiovascular: negative for chest pain, dyspnea on exertion, or new LE swelling Respiratory: negative for SOB or persistent cough Gastrointestinal: negative for abdominal pain, change in bowel habits or melena Genitourinary: negative for dysuria or gross hematuria Musculoskeletal: negative for new gait disturbance or muscular weakness Integumentary: negative for new or persistent rashes Neurological: negative for TIA or stroke symptoms Psychiatric: negative for SI or delusions Allergic/Immunologic: negative for hives  Patient Care Team    Relationship Specialty Notifications Start End  Leamon Arnt, MD PCP - General Family Medicine  05/16/20     Objective  Vitals: BP 118/64   Pulse 65   Temp (!) 97.2 F (36.2 C) (Temporal)   Resp 18   Ht '6\' 3"'  (1.905 m)   Wt 271 lb 3.2 oz (123 kg)   SpO2 99%   BMI 33.90 kg/m  General:  Well developed, well nourished, no acute distress  Psych:  Alert and oriented,normal mood and affect HEENT:  Normocephalic, atraumatic, non-icteric sclera, supple neck without adenopathy, mass or thyromegaly Cardiovascular:  RRR without gallop, rub or murmur Respiratory:  Good breath sounds bilaterally, CTAB with normal respiratory effort MSK: no deformities, contusions. Joints are without erythema or swelling Skin:  Warm, posterior hairline with a few nontender nodules without redness or drainage Neurologic:    Mental status is normal. Gross motor and sensory exams are normal.  Normal gait   Commons side effects, risks, benefits, and alternatives for medications and treatment plan prescribed today were discussed, and the patient expressed understanding of the given instructions. Patient is instructed to call or message via MyChart if he/she has any questions or concerns regarding our treatment plan. No barriers to understanding were identified. We discussed Red Flag symptoms and signs in detail. Patient expressed understanding regarding what to do in case of urgent or emergency type symptoms.   Medication list was reconciled, printed and provided to the patient in AVS. Patient instructions and summary information was reviewed with the patient as documented in the AVS. This note was prepared with assistance of Dragon voice recognition software. Occasional wrong-word or sound-a-like substitutions may have occurred due to the inherent limitations of voice recognition software  This visit occurred during the SARS-CoV-2 public health emergency.  Safety protocols were in place, including screening questions prior to the visit, additional usage of staff PPE, and extensive cleaning of exam room while observing appropriate contact time as indicated for disinfecting solutions.

## 2020-05-16 NOTE — Patient Instructions (Signed)
Please return in January 2021 for your annual complete physical; please come fasting.  Today you were given your Tdap vaccination. This is good for 10 years.  Please strongly consider quitting vaping completely. This is in your best interest for future health.   It was a pleasure meeting you today! Thank you for choosing [place] to meet your healthcare needs! I truly look forward to working with you. If you have any questions or concerns, please send me a message via Mychart or call the office at [phone].

## 2020-07-05 ENCOUNTER — Telehealth: Payer: Self-pay | Admitting: Family Medicine

## 2020-07-05 NOTE — Telephone Encounter (Signed)
Left message for patient to call back and schedule Medicare Annual Wellness Visit (AWV) either virtually/audio only OR in office. Whatever the patients preference is.  No hx; please schedule at anytime with [facility] Advisor at [facility].  This should be a 45 minute visit.  AWV-I PER [facility] AS OF 04/05/2014

## 2020-11-22 ENCOUNTER — Encounter: Payer: Medicare Other | Admitting: Family Medicine

## 2021-01-30 ENCOUNTER — Telehealth: Payer: Self-pay | Admitting: Family Medicine

## 2021-01-30 NOTE — Telephone Encounter (Signed)
Left message with household member for patient to call back and schedule Medicare Annual Wellness Visit (AWV) either virtually OR in office.   No hx; please schedule at anytime with [facility] Health Advisor at [facility].  This should be a 45 minute visit.

## 2021-10-20 ENCOUNTER — Other Ambulatory Visit: Payer: Self-pay

## 2021-10-20 ENCOUNTER — Encounter: Payer: Self-pay | Admitting: Podiatry

## 2021-10-20 ENCOUNTER — Ambulatory Visit (INDEPENDENT_AMBULATORY_CARE_PROVIDER_SITE_OTHER): Payer: Medicare Other

## 2021-10-20 ENCOUNTER — Ambulatory Visit (INDEPENDENT_AMBULATORY_CARE_PROVIDER_SITE_OTHER): Payer: Medicare Other | Admitting: Podiatry

## 2021-10-20 DIAGNOSIS — M722 Plantar fascial fibromatosis: Secondary | ICD-10-CM | POA: Diagnosis not present

## 2021-10-20 MED ORDER — TRIAMCINOLONE ACETONIDE 10 MG/ML IJ SUSP
20.0000 mg | Freq: Once | INTRAMUSCULAR | Status: AC
  Administered 2021-10-20: 20 mg

## 2021-10-20 MED ORDER — DICLOFENAC SODIUM 75 MG PO TBEC: 75.0000 mg | DELAYED_RELEASE_TABLET | Freq: Two times a day (BID) | ORAL | 2 refills | Status: AC

## 2021-10-20 NOTE — Patient Instructions (Signed)

## 2021-10-21 NOTE — Progress Notes (Signed)
Subjective:   Patient ID: Duane Beck, male   DOB: 30 y.o.   MRN: [id number]   HPI Patient presents stating that both of his heels are very sore and he is getting pain into his arches also with flatfeet.  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  Patient is not currently smoking and does like to be active if possible   Review of Systems  All other systems reviewed and are negative.      Objective:  Physical Exam Vitals and nursing note reviewed.  Constitutional:      Appearance: He is well-developed.  Pulmonary:     Effort: Pulmonary effort is normal.  Musculoskeletal:        General: Normal range of motion.  Skin:    General: Skin is warm.  Neurological:     Mental Status: He is alert.    Neurovascular status intact muscle strength found to be adequate range of motion adequate moderate obesity is noted.  He does have flatfoot deformity bilateral does have reasonable subtalar joint motion and has quite a bit of discomfort in the plantar heel region bilateral with inflammation and fluid at the insertion and extending into the arch.  Patient has good digital perfusion well oriented x3     Assessment:  Acute plantar fasciitis bilateral with significant flatfoot deformity creating stress against the plantar fascia at its insertion into the calcaneus     Plan:  H&P reviewed x-rays and discussed with him etiology of this condition and foot structure and moderate obesity as also complicating factor.  Today I went ahead I did sterile prep and I injected the plantar fascial bilateral 3 mg Kenalog 5 mg Xylocaine at the insertion of the tendon into the calcaneus and I applied fascial braces bilateral in order to lift the arch and take the stress off the plantar fascia and hopefully help this condition during the acute phase.  I discussed shoe gear modifications arch supports and he will be seen back as needed  X-rays  indicate there is spur formation in the plantar heel region bilateral there is no indications of stress fracture arthritis

## 2021-10-25 ENCOUNTER — Telehealth: Payer: Self-pay | Admitting: Podiatry

## 2021-10-25 NOTE — Telephone Encounter (Signed)
Patient needs a return to work note staying that Dr Charlsie Merles took him out of work from 10/20/21 to  10/25/21 he will return to work 10/26/21 . Please advise    Email :  [email or web address]

## 2021-11-01 ENCOUNTER — Encounter: Payer: Self-pay | Admitting: Podiatry

## 2021-11-01 NOTE — Telephone Encounter (Signed)
That's fine

## 2023-06-20 ENCOUNTER — Telehealth: Payer: Self-pay | Admitting: Family Medicine

## 2023-06-20 NOTE — Telephone Encounter (Signed)
Patient last seen 07.2021- left voicemail to see if patient would like to continue care with [facility] or if the patient has established care somewhere else
